# Patient Record
Sex: Male | Born: 1965 | Race: White | Hispanic: No | Smoking: Current every day smoker
Health system: Southern US, Community
[De-identification: ages and names within clinical notes are randomized; demographics above are authoritative.]

## PROBLEM LIST (undated history)

## (undated) DIAGNOSIS — N289 Disorder of kidney and ureter, unspecified: Secondary | ICD-10-CM

---

## 2000-11-15 ENCOUNTER — Emergency Department (HOSPITAL_COMMUNITY): Admission: EM | Admit: 2000-11-15 | Discharge: 2000-11-15 | Payer: Self-pay | Admitting: Emergency Medicine

## 2003-08-25 ENCOUNTER — Emergency Department (HOSPITAL_COMMUNITY): Admission: EM | Admit: 2003-08-25 | Discharge: 2003-08-25 | Payer: Self-pay | Admitting: Emergency Medicine

## 2003-08-29 ENCOUNTER — Ambulatory Visit (HOSPITAL_BASED_OUTPATIENT_CLINIC_OR_DEPARTMENT_OTHER): Admission: RE | Admit: 2003-08-29 | Discharge: 2003-08-29 | Payer: Self-pay | Admitting: Urology

## 2004-06-28 ENCOUNTER — Emergency Department (HOSPITAL_COMMUNITY): Admission: EM | Admit: 2004-06-28 | Discharge: 2004-06-28 | Payer: Self-pay | Admitting: Emergency Medicine

## 2012-04-15 DIAGNOSIS — Z23 Encounter for immunization: Secondary | ICD-10-CM

## 2013-04-15 ENCOUNTER — Ambulatory Visit (INDEPENDENT_AMBULATORY_CARE_PROVIDER_SITE_OTHER): Payer: 59

## 2013-04-15 DIAGNOSIS — Z23 Encounter for immunization: Secondary | ICD-10-CM

## 2014-03-04 ENCOUNTER — Emergency Department: Payer: Self-pay | Admitting: Emergency Medicine

## 2014-03-04 LAB — COMPREHENSIVE METABOLIC PANEL
Albumin: 3.7 g/dL (ref 3.4–5.0)
Alkaline Phosphatase: 142 U/L — ABNORMAL HIGH
Anion Gap: 9 (ref 7–16)
BUN: 12 mg/dL (ref 7–18)
Bilirubin,Total: 0.2 mg/dL (ref 0.2–1.0)
Calcium, Total: 8.5 mg/dL (ref 8.5–10.1)
Chloride: 109 mmol/L — ABNORMAL HIGH (ref 98–107)
Co2: 23 mmol/L (ref 21–32)
Creatinine: 0.89 mg/dL (ref 0.60–1.30)
EGFR (African American): >60
EGFR (Non-African Amer.): >60
Glucose: 83 mg/dL (ref 65–99)
Osmolality: 280 (ref 275–301)
Potassium: 3.9 mmol/L (ref 3.5–5.1)
SGOT(AST): 25 U/L (ref 15–37)
SGPT (ALT): 19 U/L
Sodium: 141 mmol/L (ref 136–145)
Total Protein: 7 g/dL (ref 6.4–8.2)

## 2014-03-04 LAB — URINALYSIS, COMPLETE
Bacteria: NONE SEEN
Bilirubin,UR: NEGATIVE
Glucose,UR: NEGATIVE mg/dL (ref 0–75)
Ketone: NEGATIVE
Leukocyte Esterase: NEGATIVE
Nitrite: NEGATIVE
Ph: 5 (ref 4.5–8.0)
Protein: NEGATIVE
RBC,UR: 126 /HPF (ref 0–5)
Specific Gravity: 1.017 (ref 1.003–1.030)
Squamous Epithelial: NONE SEEN
WBC UR: 2 /HPF (ref 0–5)

## 2014-03-04 LAB — CBC
HCT: 46.7 % (ref 40.0–52.0)
HGB: 16 g/dL (ref 13.0–18.0)
MCH: 32.8 pg (ref 26.0–34.0)
MCHC: 34.3 g/dL (ref 32.0–36.0)
MCV: 96 fL (ref 80–100)
Platelet: 232 10*3/uL (ref 150–440)
RBC: 4.89 10*6/uL (ref 4.40–5.90)
RDW: 13.6 % (ref 11.5–14.5)
WBC: 9.2 10*3/uL (ref 3.8–10.6)

## 2017-03-10 ENCOUNTER — Ambulatory Visit (INDEPENDENT_AMBULATORY_CARE_PROVIDER_SITE_OTHER)
Admit: 2017-03-10 | Discharge: 2017-03-10 | Disposition: A | Discharge status: Home / Self Care | Payer: Commercial Managed Care - PPO | Attending: Emergency Medicine | Admitting: Emergency Medicine

## 2017-03-10 ENCOUNTER — Encounter: Payer: Self-pay | Admitting: Emergency Medicine

## 2017-03-10 ENCOUNTER — Ambulatory Visit
Admission: EM | Admit: 2017-03-10 | Discharge: 2017-03-10 | Disposition: A | Discharge status: Home / Self Care | Payer: Commercial Managed Care - PPO | Attending: Family Medicine | Admitting: Family Medicine

## 2017-03-10 DIAGNOSIS — R110 Nausea: Secondary | ICD-10-CM | POA: Diagnosis not present

## 2017-03-10 DIAGNOSIS — R197 Diarrhea, unspecified: Secondary | ICD-10-CM

## 2017-03-10 DIAGNOSIS — R1010 Upper abdominal pain, unspecified: Secondary | ICD-10-CM

## 2017-03-10 DIAGNOSIS — N200 Calculus of kidney: Secondary | ICD-10-CM

## 2017-03-10 DIAGNOSIS — R1032 Left lower quadrant pain: Secondary | ICD-10-CM

## 2017-03-10 DIAGNOSIS — R1012 Left upper quadrant pain: Secondary | ICD-10-CM

## 2017-03-10 DIAGNOSIS — N281 Cyst of kidney, acquired: Secondary | ICD-10-CM | POA: Diagnosis not present

## 2017-03-10 HISTORY — DX: Disorder of kidney and ureter, unspecified: N28.9

## 2017-03-10 LAB — CBC WITH DIFFERENTIAL/PLATELET
Basophils Absolute: 0.1 10*3/uL (ref 0–0.1)
Basophils Relative: 1 %
Eosinophils Absolute: 0.1 10*3/uL (ref 0–0.7)
Eosinophils Relative: 1 %
HCT: 46.5 % (ref 40.0–52.0)
Hemoglobin: 15.9 g/dL (ref 13.0–18.0)
Lymphocytes Relative: 27 %
Lymphs Abs: 3.5 10*3/uL (ref 1.0–3.6)
MCH: 32.6 pg (ref 26.0–34.0)
MCHC: 34.2 g/dL (ref 32.0–36.0)
MCV: 95.4 fL (ref 80.0–100.0)
Monocytes Absolute: 0.7 10*3/uL (ref 0.2–1.0)
Monocytes Relative: 6 %
Neutro Abs: 8.5 10*3/uL — ABNORMAL HIGH (ref 1.4–6.5)
Neutrophils Relative %: 65 %
Platelets: 257 10*3/uL (ref 150–440)
RBC: 4.88 MIL/uL (ref 4.40–5.90)
RDW: 14.1 % (ref 11.5–14.5)
WBC: 12.9 10*3/uL — ABNORMAL HIGH (ref 3.8–10.6)

## 2017-03-10 LAB — COMPREHENSIVE METABOLIC PANEL
ALT: 18 U/L (ref 17–63)
AST: 24 U/L (ref 15–41)
Albumin: 4 g/dL (ref 3.5–5.0)
Alkaline Phosphatase: 124 U/L (ref 38–126)
Anion gap: 7 (ref 5–15)
BUN: 16 mg/dL (ref 6–20)
CO2: 23 mmol/L (ref 22–32)
Calcium: 8.7 mg/dL — ABNORMAL LOW (ref 8.9–10.3)
Chloride: 107 mmol/L (ref 101–111)
Creatinine, Ser: 0.83 mg/dL (ref 0.61–1.24)
GFR calc Af Amer: >60 mL/min (ref >60)
GFR calc non Af Amer: >60 mL/min (ref >60)
Glucose, Bld: 120 mg/dL — ABNORMAL HIGH (ref 65–99)
Potassium: 3.7 mmol/L (ref 3.5–5.1)
Sodium: 137 mmol/L (ref 135–145)
Total Bilirubin: 0.6 mg/dL (ref 0.3–1.2)
Total Protein: 7.1 g/dL (ref 6.5–8.1)

## 2017-03-10 LAB — URINALYSIS, COMPLETE (UACMP) WITH MICROSCOPIC
Bacteria, UA: NONE SEEN
Bilirubin Urine: NEGATIVE
Glucose, UA: NEGATIVE mg/dL
Hgb urine dipstick: NEGATIVE
Ketones, ur: NEGATIVE mg/dL
Leukocytes, UA: NEGATIVE
NITRITE: NEGATIVE
Protein, ur: NEGATIVE mg/dL
Specific Gravity, Urine: 1.02 (ref 1.005–1.030)
Squamous Epithelial / HPF: NONE SEEN
pH: 5.5 (ref 5.0–8.0)

## 2017-03-10 MED ORDER — NAPROXEN 500 MG PO TABS: 500.0000 mg | ORAL_TABLET | Freq: Two times a day (BID) | ORAL | 0 refills | Status: AC

## 2017-03-10 MED ORDER — KETOROLAC TROMETHAMINE 60 MG/2ML IM SOLN
60.0000 mg | Freq: Once | INTRAMUSCULAR | Status: AC
  Administered 2017-03-10: 60 mg via INTRAMUSCULAR

## 2017-03-10 NOTE — ED Notes (Signed)
I spoke to NunicaNico, Care Mgr at [facility] regarding prior authorization. He reports this procedure for CT abd/pelvis does not require prior authorization.

## 2017-03-10 NOTE — ED Provider Notes (Signed)
[facility]    CSN: [id number] Arrival date & time: 03/10/17  1152     History   Chief Complaint Chief Complaint  Patient presents with  . Nephrolithiasis  . Diarrhea    HPI Terry Dawson is a 51 y.o. male.   HPI  This a 51 year old male who presents stating that he passed 4 kidney stone since yesterday. Having some left-sided flank and abdominal pain. He's had a history of kidney stones in the past. He says the nausea but no vomiting.  Second complaint is that he's had diarrhea for 2 months solid. He states that the diarrhea is watery no blood or mucus. Going between 2 and 3 times daily. States he's had weight loss has been unintentional. He says the diarrhea is urgent when he has to go usually of a green foul-smelling consistency. He does have abdominal cramping.         Past Medical History:  Diagnosis Date  . Renal disorder     There are no active problems to display for this patient.   History reviewed. No pertinent surgical history.     Home Medications    Prior to Admission medications   Medication Sig Start Date End Date Taking? Authorizing Provider  naproxen (NAPROSYN) 500 MG tablet Take 1 tablet (500 mg total) by mouth 2 (two) times daily with a meal. 03/10/17   Lutricia Feil, PA-C    Family History History reviewed. No pertinent family history.  Social History Social History  Substance Use Topics  . Smoking status: Current Every Day Smoker    Types: Cigarettes  . Smokeless tobacco: Never Used  . Alcohol use Yes     Allergies   Patient has no known allergies.   Review of Systems Review of Systems  Constitutional: Positive for activity change and appetite change. Negative for chills, fatigue and fever.  Gastrointestinal: Positive for abdominal pain, diarrhea and nausea. Negative for abdominal distention, rectal pain and vomiting.  Genitourinary: Positive for flank pain.  All other systems reviewed and are  negative.    Physical Exam Triage Vital Signs ED Triage Vitals  Enc Vitals Group     BP 03/10/17 1232 (!) 152/96     Pulse Rate 03/10/17 1232 78     Resp 03/10/17 1232 16     Temp 03/10/17 1232 98.3 F (36.8 C)     Temp Source 03/10/17 1232 Oral     SpO2 03/10/17 1232 100 %     Weight 03/10/17 1231 161 lb (73 kg)     Height 03/10/17 1231 5\' 7"  (1.702 m)     Head Circumference --      Peak Flow --      Pain Score 03/10/17 1231 7     Pain Loc --      Pain Edu? --      Excl. in GC? --    No data found.   Updated Vital Signs BP (!) 152/96 (BP Location: Left Arm)   Pulse 78   Temp 98.3 F (36.8 C) (Oral)   Resp 16   Ht 5\' 7"  (1.702 m)   Wt 161 lb (73 kg)   SpO2 100%   BMI 25.22 kg/m   Visual Acuity Right Eye Distance:   Left Eye Distance:   Bilateral Distance:    Right Eye Near:   Left Eye Near:    Bilateral Near:     Physical Exam   UC Treatments / Results  Labs (all labs  ordered are listed, but only abnormal results are displayed) Labs Reviewed  CBC WITH DIFFERENTIAL/PLATELET - Abnormal; Notable for the following:       Result Value   WBC 12.9 (*)    Neutro Abs 8.5 (*)    All other components within normal limits  COMPREHENSIVE METABOLIC PANEL - Abnormal; Notable for the following:    Glucose, Bld 120 (*)    Calcium 8.7 (*)    All other components within normal limits  URINALYSIS, COMPLETE (UACMP) WITH MICROSCOPIC    EKG  EKG Interpretation None       Radiology Ct Renal Stone Study  Result Date: 03/10/2017 CLINICAL DATA:  51 year old male passed 4 kidney stones yesterday. Reports some left sided abdominal pain. Episodes diarrhea for the past 2 months. Initial encounter. EXAM: CT ABDOMEN AND PELVIS WITHOUT CONTRAST TECHNIQUE: Multidetector CT imaging of the abdomen and pelvis was performed following the standard protocol without IV contrast. COMPARISON:  06/28/2004 CT. FINDINGS: Lower chest: No worrisome lung base abnormality. Heart size within  normal limits. Hepatobiliary: Scattered low-density structures throughout the liver, larger ones which are cysts and others too small adequately characterize. No calcified gallstone. Pancreas: Taking into account limitation by non contrast imaging, no pancreatic mass or inflammation. Spleen: Taking into account limitation by non contrast imaging, no splenic mass or enlargement. Adrenals/Urinary Tract: Left upper pole 3 mm and left lower pole 4 mm nonobstructing stone. Findings suggestive of left parapelvic cysts. No right renal or ureteral calculi or right-sided hydronephrosis. Taking into account limitation by non contrast imaging, no renal or adrenal mass. Contracted noncontrast filled urinary bladder. Stomach/Bowel: Portions of the bowel are under distended limiting evaluation. No extraluminal bowel inflammatory process. Radiopaque material within the appendix may represent residual contrast or tiny calculi. Vascular/Lymphatic: No aortic aneurysm.  No adenopathy. Reproductive: Negative. Other: No bowel containing hernia. No free intraperitoneal air. No abnormal fluid collection. Musculoskeletal: Minimal degenerative changes lower thoracic spine. IMPRESSION: Left upper pole 3 mm and left lower pole 4 mm nonobstructing stone. Findings suggestive of left parapelvic cysts. No right renal or ureteral calculi or right-sided hydronephrosis. No extraluminal bowel inflammatory process. Radiopaque material within the appendix may represent residual contrast or tiny calculi. Scattered low-density structures throughout the liver, larger ones which are cysts and others too small adequately characterize. Electronically Signed   By: Lacy DuverneySteven  Olson M.D.   On: 03/10/2017 14:43    Procedures Procedures (including critical care time)  Medications Ordered in UC Medications  ketorolac (TORADOL) injection 60 mg (60 mg Intramuscular Given 03/10/17 1336)     Initial Impression / Assessment and Plan / UC Course  I have reviewed  the triage vital signs and the nursing notes.  Pertinent labs & imaging results that were available during my care of the patient were reviewed by me and considered in my medical decision making (see chart for details).     Plan: 1. Test/x-ray results and diagnosis reviewed with patient 2. rx as per orders; risks, benefits, potential side effects reviewed with patient 3. Recommend supportive treatment with drinking plenty of fluids. Kidney stone seen today on the CAT scan are in the pelvis and has not descended yet. For his diarrhea he'll need to have a workup performed through her primary care office. You need to make an appointment as soon as possible. 4. F/u prn if symptoms worsen or don't improve   Final Clinical Impressions(s) / UC Diagnoses   Final diagnoses:  Diarrhea in adult patient  Kidney stone on left  side    New Prescriptions Discharge Medication List as of 03/10/2017  3:09 PM    START taking these medications   Details  naproxen (NAPROSYN) 500 MG tablet Take 1 tablet (500 mg total) by mouth 2 (two) times daily with a meal., Starting Tue 03/10/2017, Normal         Controlled Substance Prescriptions [place] Controlled Substance Registry consulted? Not Applicable   Lutricia Feil, PA-C 03/10/17 1610

## 2017-03-10 NOTE — Discharge Instructions (Signed)
Make appointment to follow-up with the primary care physician because of your chronic diarrhea.

## 2017-03-10 NOTE — ED Triage Notes (Signed)
Patient reports that he passed 4 kidney stones since yesterday.  Patient reports some left sided abdominal pain.  Patient denies N/V.  Patient reports also diarrhea for the past 2 months.

## 2017-04-15 ENCOUNTER — Ambulatory Visit: Payer: Self-pay | Admitting: Family Medicine

## 2017-06-01 ENCOUNTER — Ambulatory Visit: Payer: Self-pay | Admitting: Family Medicine

## 2018-07-09 IMAGING — CT CT RENAL STONE PROTOCOL
2 of 4 series · 16 of 46 positions shown, 18 images · non-contrast
Comparison: 06/28/2004 CT.

CLINICAL DATA: 50-year-old male passed 4 kidney stones yesterday.
Reports some left sided abdominal pain. Episodes diarrhea for the
past 2 months. Initial encounter.

EXAM:
CT ABDOMEN AND PELVIS WITHOUT CONTRAST
TECHNIQUE: Multidetector CT imaging of the abdomen and pelvis was performed
following the standard protocol without IV contrast.

[Series 2: soft tissue · axial · 0.68mm/px · z∈[-892,-447]mm · 13 of 99 slices shown, 15 images]
[im 5/99  soft-tissue]
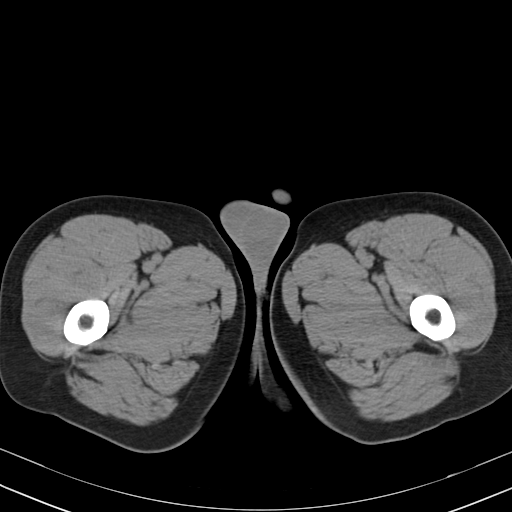
[im 5/99  bone]
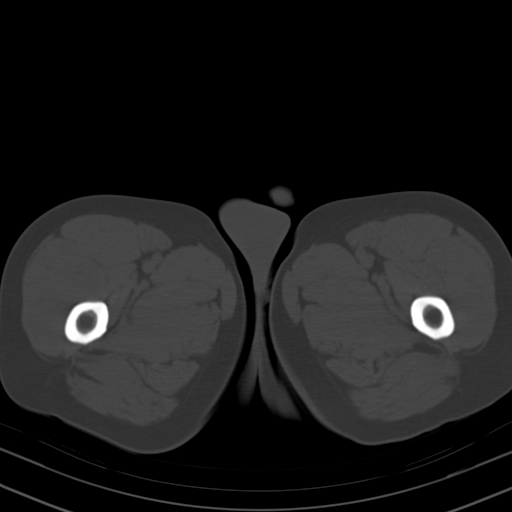
[im 13/99  soft-tissue]
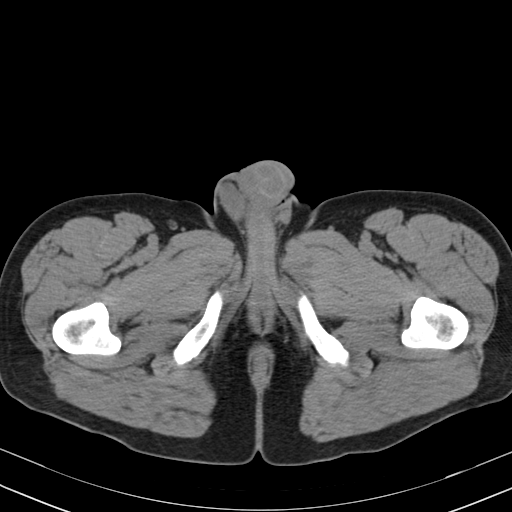
[im 21/99  soft-tissue]
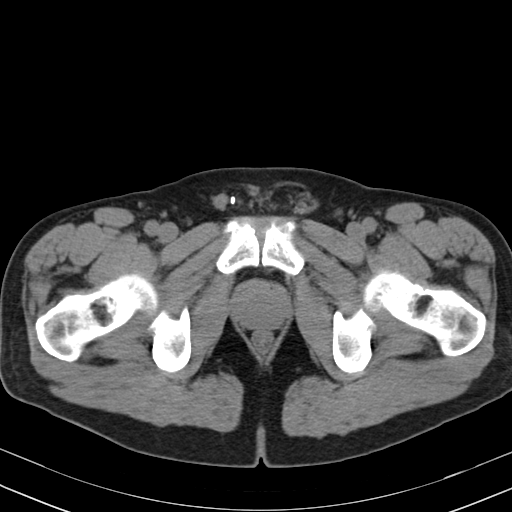
[im 29/99  soft-tissue]
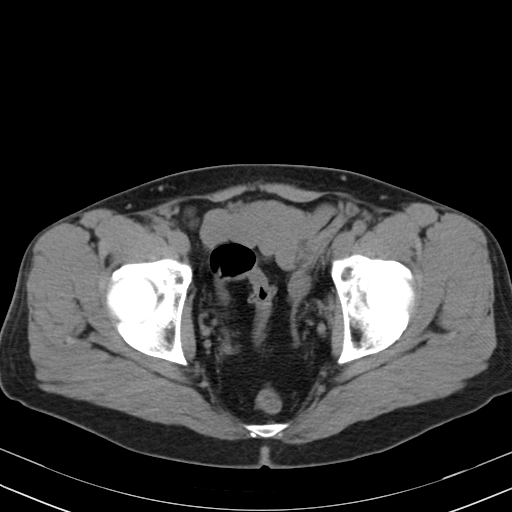
[im 33/99  soft-tissue]
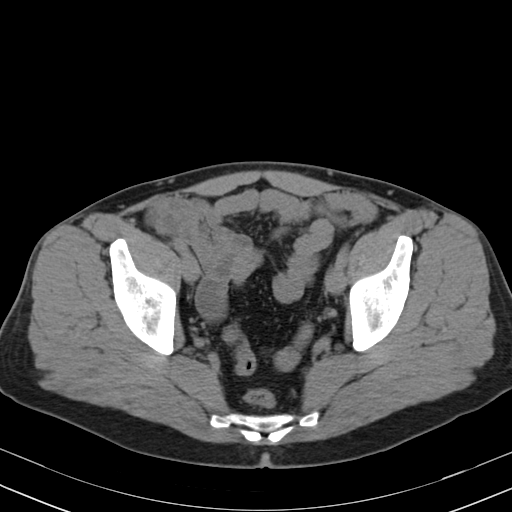
[im 41/99  soft-tissue]
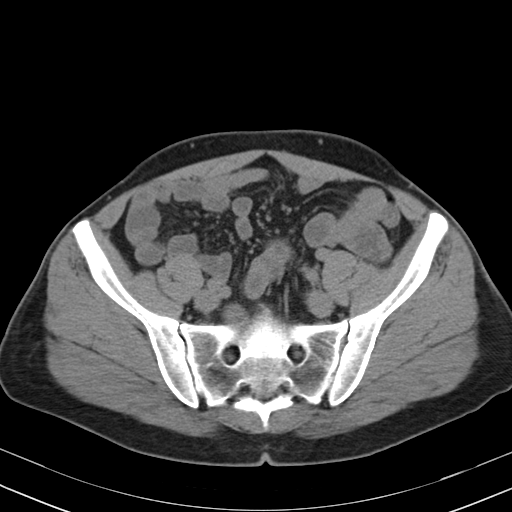
[im 50/99  soft-tissue]
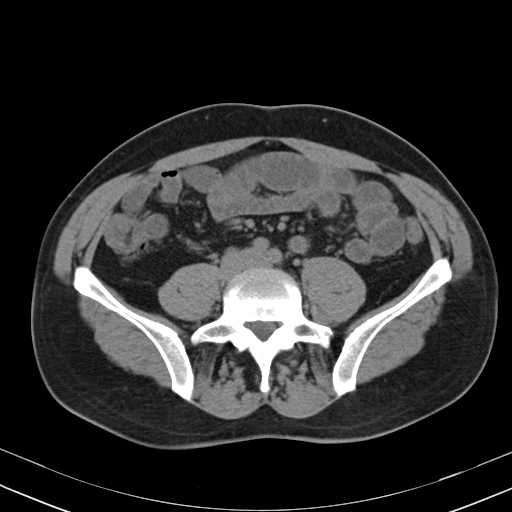
[im 58/99  soft-tissue]
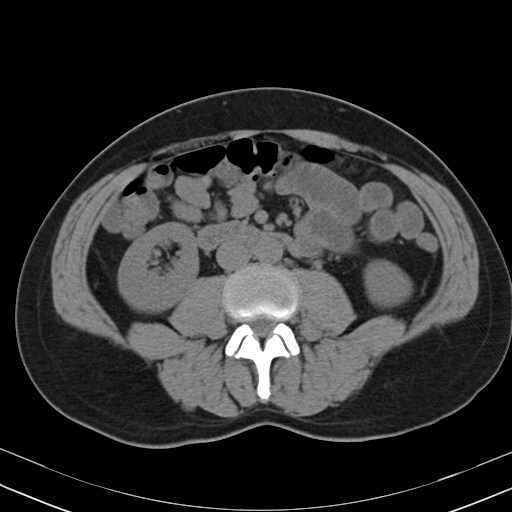
[im 66/99  soft-tissue]
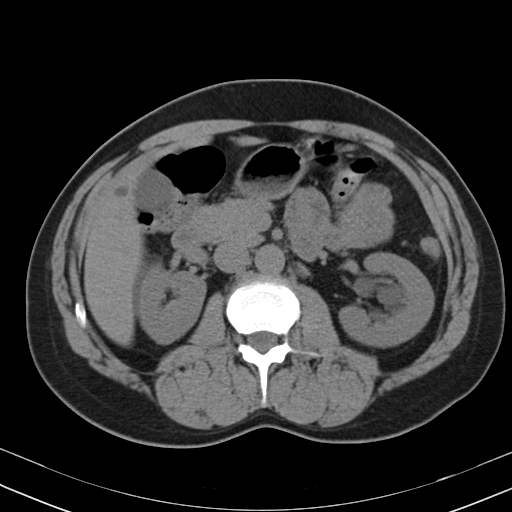
[im 66/99  bone]
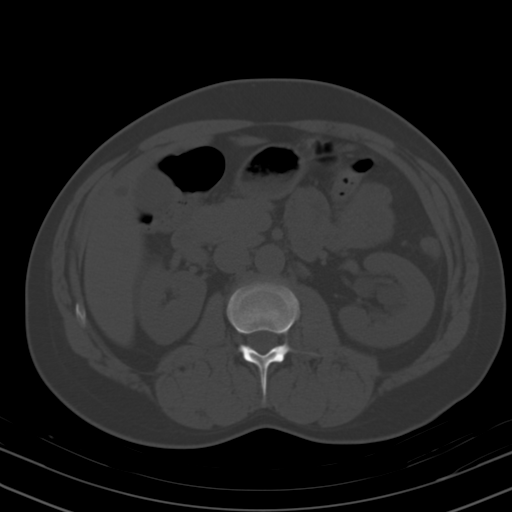
[im 70/99  soft-tissue]
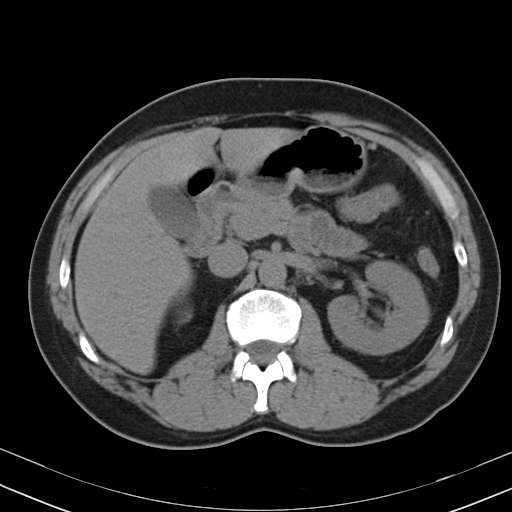
[im 78/99  soft-tissue]
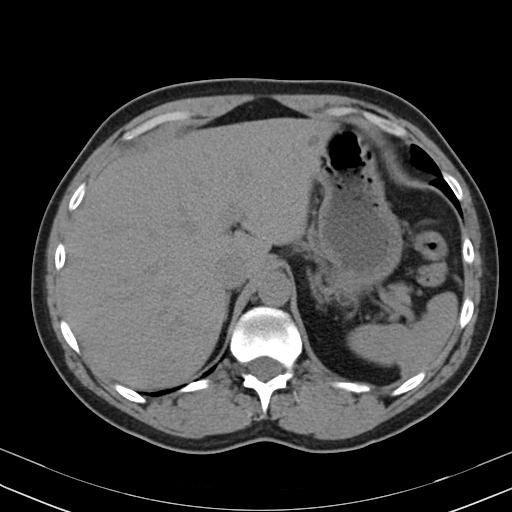
[im 86/99  soft-tissue]
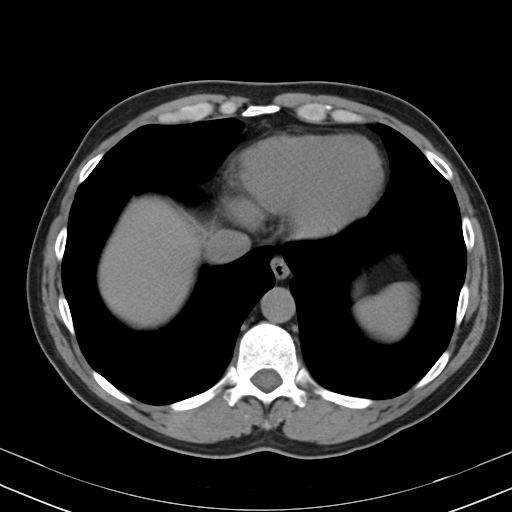
[im 94/99  soft-tissue]
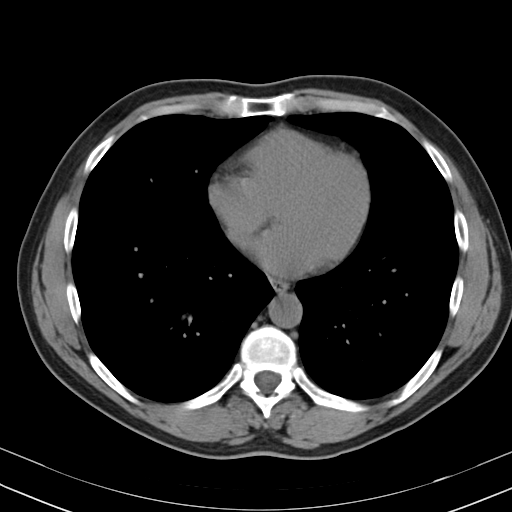

[Series 602: coronal · coronal · 0.96mm/px · 3 of 115 slices shown]
[im 39/115  soft-tissue]
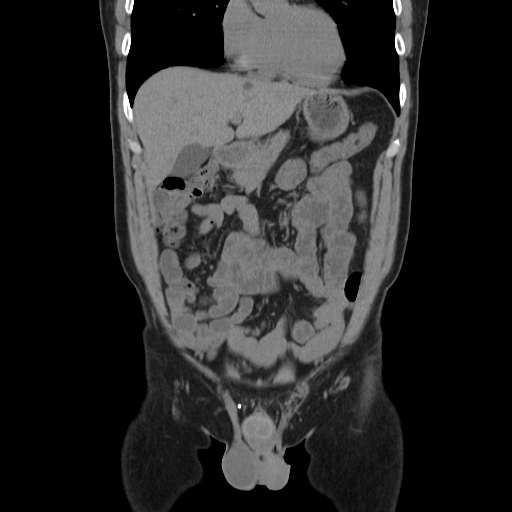
[im 51/115  soft-tissue]
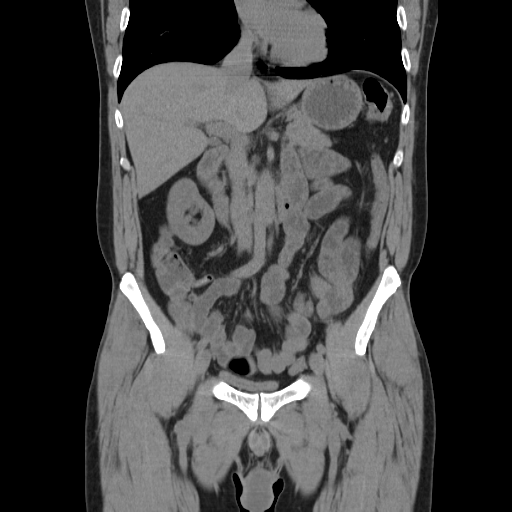
[im 64/115  soft-tissue]
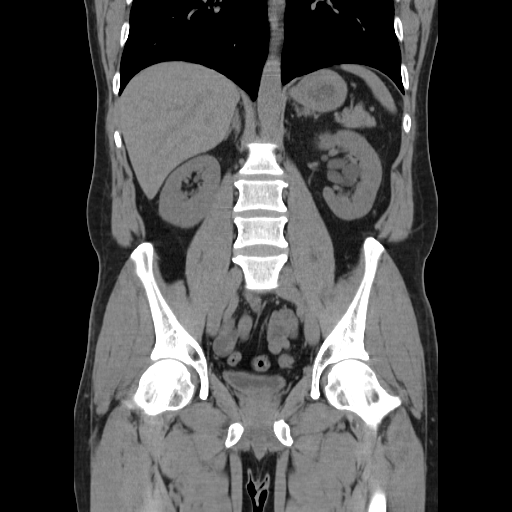

[16 of 46 positions shown; findings below may reference images not displayed]

FINDINGS: Lower chest: No worrisome lung base abnormality. Heart size within
normal limits.

Hepatobiliary: Scattered low-density structures throughout the
liver, larger ones which are cysts and others too small adequately
characterize. No calcified gallstone.

Pancreas: Taking into account limitation by non contrast imaging, no
pancreatic mass or inflammation.

Spleen: Taking into account limitation by non contrast imaging, no
splenic mass or enlargement.

Adrenals/Urinary Tract: Left upper pole 3 mm and left lower pole 4
mm nonobstructing stone. Findings suggestive of left parapelvic
cysts.

No right renal or ureteral calculi or right-sided hydronephrosis.

Taking into account limitation by non contrast imaging, no renal or
adrenal mass.

Contracted noncontrast filled urinary bladder.

Stomach/Bowel: Portions of the bowel are under distended limiting
evaluation. No extraluminal bowel inflammatory process. Radiopaque
material within the appendix may represent residual contrast or tiny
calculi.

Vascular/Lymphatic: No aortic aneurysm.  No adenopathy.

Reproductive: Negative.

Other: No bowel containing hernia. No free intraperitoneal air. No
abnormal fluid collection.

Musculoskeletal: Minimal degenerative changes lower thoracic spine.
IMPRESSION: Left upper pole 3 mm and left lower pole 4 mm nonobstructing stone.
Findings suggestive of left parapelvic cysts.

No right renal or ureteral calculi or right-sided hydronephrosis.

No extraluminal bowel inflammatory process. Radiopaque material
within the appendix may represent residual contrast or tiny calculi.

Scattered low-density structures throughout the liver, larger ones
which are cysts and others too small adequately characterize.
